# Patient Record
Sex: Female | Born: 1995 | Race: Black or African American | Hispanic: No | Marital: Single | Smoking: Current some day smoker
Health system: Southern US, Community
[De-identification: ages and names within clinical notes are randomized; demographics above are authoritative.]

## PROBLEM LIST (undated history)

## (undated) DIAGNOSIS — R55 Syncope and collapse: Secondary | ICD-10-CM

## (undated) DIAGNOSIS — Z789 Other specified health status: Secondary | ICD-10-CM

## (undated) HISTORY — PX: MOUTH SURGERY: SHX715

---
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
## 2015-11-28 ENCOUNTER — Encounter (HOSPITAL_COMMUNITY): Payer: Self-pay | Admitting: Emergency Medicine

## 2015-11-28 DIAGNOSIS — F17290 Nicotine dependence, other tobacco product, uncomplicated: Secondary | ICD-10-CM | POA: Insufficient documentation

## 2015-11-28 DIAGNOSIS — R05 Cough: Secondary | ICD-10-CM | POA: Diagnosis present

## 2015-11-28 DIAGNOSIS — J069 Acute upper respiratory infection, unspecified: Secondary | ICD-10-CM | POA: Diagnosis not present

## 2015-11-28 NOTE — ED Triage Notes (Signed)
Pt reports nonproductive coughing for a week with a slight fever at times, currently afebrile.  Denies SOB or CP, abd pain, sore throat, or N/V. Went to Urgent Care and received a steroid shot and an anttitussive which helped for a time before coming here with increased coughing.  No flu-like symptoms reported or seen.

## 2015-11-29 ENCOUNTER — Emergency Department (HOSPITAL_COMMUNITY): Payer: 59

## 2015-11-29 ENCOUNTER — Emergency Department (HOSPITAL_COMMUNITY)
Admission: EM | Admit: 2015-11-29 | Discharge: 2015-11-29 | Disposition: A | Discharge status: Home / Self Care | Payer: 59 | Attending: Emergency Medicine | Admitting: Emergency Medicine

## 2015-11-29 DIAGNOSIS — J069 Acute upper respiratory infection, unspecified: Secondary | ICD-10-CM

## 2015-11-29 DIAGNOSIS — B9789 Other viral agents as the cause of diseases classified elsewhere: Secondary | ICD-10-CM

## 2015-11-29 MED ORDER — CETIRIZINE HCL 10 MG PO TABS: 10.0000 mg | ORAL_TABLET | Freq: Every day | ORAL | 1 refills | Status: AC

## 2015-11-29 MED ORDER — HYDROCODONE-HOMATROPINE 5-1.5 MG/5ML PO SYRP: 5.0000 mL | ORAL_SOLUTION | Freq: Every evening | ORAL | 0 refills | Status: AC | PRN

## 2015-11-29 NOTE — ED Provider Notes (Signed)
[facility] DEPT Provider Note   CSN: [id number] Arrival date & time: 11/28/15  2327 By signing my name below, I, Doran Stabler, attest that this documentation has been prepared under the direction and in the presence of [employer], PA-C. Electronically Signed: Doran Stabler, ED Scribe. 11/29/15. 12:27 AM.   History   Chief Complaint Chief Complaint  Patient presents with  . Cough  The history is provided by the patient. No language interpreter was used.   HPI Comments: Priscilla Sanchez is a 20 y.o. female who presents to the Emergency Department with no pertinent PMHx complaining of a persistent cough that began 5 days ago. Pt also reports fever, Tmax 100.50F, sore throat, and nasal congestion. Pt states there is a smoker in her home and with her new HVAC it may be causing her symptoms. She states her symptoms are worse while at home. Pt was seen at Urgent Care today and was given Tessalon Perles. She went home but could not bare the cough so she came to the ED for further evaluation. Pt denies chills, CP, SOB, N/V/D or any other symptoms at this time.   Pt socially smokes Hookha on occasion.   Past Medical History:  Diagnosis Date  . Fainting    There are no active problems to display for this patient.  History reviewed. No pertinent surgical history.  OB History    No data available     Home Medications    Prior to Admission medications   Medication Sig Start Date End Date Taking? Authorizing Provider  cetirizine (ZYRTEC ALLERGY) 10 MG tablet Take 1 tablet (10 mg total) by mouth daily. 11/29/15   Antonietta Breach, PA-C  HYDROcodone-homatropine (HYCODAN) 5-1.5 MG/5ML syrup Take 5 mLs by mouth at bedtime as needed for cough. 11/29/15   Antonietta Breach, PA-C  Norgestim-Eth Estrad Triphasic (ORTHO TRI-CYCLEN LO PO) Take 1 tablet by mouth daily.    Historical Provider, MD   Family History No family history on file.  Social History Social History  Substance Use Topics  . Smoking  status: Current Some Day Smoker    Types: Pipe  . Smokeless tobacco: Never Used  . Alcohol use No   Allergies   Penicillins  Review of Systems Review of Systems A complete 10 system review of systems was obtained and all systems are negative except as noted in the HPI and PMH.    Physical Exam Updated Vital Signs BP 136/86   Pulse 88   Temp 98.9 F (37.2 C) (Oral)   Resp 18   Ht 5\' 6"  (1.676 m)   Wt 53.1 kg   LMP 11/10/2015   SpO2 99%   BMI 18.90 kg/m   Physical Exam  Constitutional: She is oriented to person, place, and time. She appears well-developed and well-nourished. No distress.  Nontoxic-appearing  HENT:  Head: Normocephalic and atraumatic.  Eyes: Conjunctivae and EOM are normal. No scleral icterus.  Neck: Normal range of motion.  Cardiovascular: Normal rate, regular rhythm and intact distal pulses.   Pulmonary/Chest: Effort normal. No respiratory distress. She has no wheezes. She has no rales.  Lungs clear to auscultation bilaterally  Musculoskeletal: Normal range of motion.  Neurological: She is alert and oriented to person, place, and time. She exhibits normal muscle tone. Coordination normal.  Skin: Skin is warm and dry. No rash noted. She is not diaphoretic. No erythema. No pallor.  Psychiatric: She has a normal mood and affect. Her behavior is normal.  Nursing note and vitals reviewed.  ED Treatments / Results  DIAGNOSTIC STUDIES: Oxygen Saturation is 100% on room air, normal by my interpretation.    COORDINATION OF CARE: 12:36 AM Discussed treatment plan with pt at bedside and pt agreed to plan.  Labs (all labs ordered are listed, but only abnormal results are displayed) Labs Reviewed - No data to display  EKG  EKG Interpretation None       Radiology Dg Chest 2 View  Result Date: 11/29/2015 CLINICAL DATA:  Nonproductive cough for 1 week. Subjective intermittent fever. EXAM: CHEST  2 VIEW COMPARISON:  None. FINDINGS: The lungs are  clear. The pulmonary vasculature is normal. Heart size is normal. Hilar and mediastinal contours are unremarkable. There is no pleural effusion. IMPRESSION: No active cardiopulmonary disease. Electronically Signed   By: Andreas Newport M.D.   On: 11/29/2015 01:05    Procedures Procedures (including critical care time)  Medications Ordered in ED Medications - No data to display   Initial Impression / Assessment and Plan / ED Course  I have reviewed the triage vital signs and the nursing notes.  Pertinent labs & imaging results that were available during my care of the patient were reviewed by me and considered in my medical decision making (see chart for details).  Clinical Course     Pt CXR negative for acute infiltrate. Patient's symptoms are consistent with URI, likely viral etiology. Discussed that antibiotics are not indicated for viral infections. Pt will be discharged with symptomatic treatment. Patient verbalizes understanding and is agreeable with plan. Pt is hemodynamically stable and in NAD prior to discharge.   Final Clinical Impressions(s) / ED Diagnoses   Final diagnoses:  Viral URI with cough    New Prescriptions New Prescriptions   CETIRIZINE (ZYRTEC ALLERGY) 10 MG TABLET    Take 1 tablet (10 mg total) by mouth daily.   HYDROCODONE-HOMATROPINE (HYCODAN) 5-1.5 MG/5ML SYRUP    Take 5 mLs by mouth at bedtime as needed for cough.    I personally performed the services described in this documentation, which was scribed in my presence. The recorded information has been reviewed and is accurate.      Antonietta Breach, PA-C 11/29/15 0130    Varney Biles, MD 11/29/15 [phone]

## 2019-05-17 ENCOUNTER — Other Ambulatory Visit: Payer: Self-pay | Admitting: Family Medicine

## 2019-05-17 DIAGNOSIS — N6320 Unspecified lump in the left breast, unspecified quadrant: Secondary | ICD-10-CM

## 2019-05-23 ENCOUNTER — Other Ambulatory Visit: Payer: Self-pay | Admitting: Family Medicine

## 2019-05-23 ENCOUNTER — Ambulatory Visit
Admission: RE | Admit: 2019-05-23 | Discharge: 2019-05-23 | Disposition: A | Discharge status: Home / Self Care | Payer: 59 | Source: Ambulatory Visit | Attending: Family Medicine | Admitting: Family Medicine

## 2019-05-23 ENCOUNTER — Other Ambulatory Visit: Payer: Self-pay

## 2019-05-23 DIAGNOSIS — N6320 Unspecified lump in the left breast, unspecified quadrant: Secondary | ICD-10-CM

## 2019-06-04 ENCOUNTER — Ambulatory Visit
Admission: RE | Admit: 2019-06-04 | Discharge: 2019-06-04 | Disposition: A | Discharge status: Home / Self Care | Payer: No Typology Code available for payment source | Source: Ambulatory Visit | Attending: Family Medicine | Admitting: Family Medicine

## 2019-06-04 ENCOUNTER — Other Ambulatory Visit: Payer: Self-pay

## 2019-06-04 DIAGNOSIS — N6320 Unspecified lump in the left breast, unspecified quadrant: Secondary | ICD-10-CM

## 2019-06-07 ENCOUNTER — Ambulatory Visit: Payer: Self-pay | Admitting: Surgery

## 2019-06-07 DIAGNOSIS — D242 Benign neoplasm of left breast: Secondary | ICD-10-CM

## 2019-06-07 NOTE — H&P (Signed)
Danley Danker Appointment: 06/07/2019 11:00 AM Location: [facility] Patient #: [id number] DOB: 05-Feb-1995 Single / Language: Priscilla Sanchez / Race: Black or African American Female  History of Present Illness Marcello Moores A. Keierra Nudo MD; 06/07/2019 12:34 PM) Patient words: Patient presents for evaluation of left breast mass. She's had she was 12 zolpidem. 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 It's about 5 cm in maximal diameter. It is not causing any pain but there has been no substantial increase in size with minor reduction noted. No pain, discharge or change in the appearance of the breast.      24 year old female with a mass in the left breast that has been stable for approximately 14 years. However the mass now is bothersome to the patient. She reports heaviness in the breast.  EXAM: ULTRASOUND OF THE LEFT BREAST  COMPARISON: Previous exam(s).  FINDINGS: On physical exam, I palpate a discrete mass deep within the retroareolar left breast.  Targeted ultrasound is performed in the left breast at 6 o'clock/retroareolar at the palpable site demonstrating an oval circumscribed hypoechoic mass measuring 5.1 x 2.9 x 5.2 cm, previously measuring up to 5.9 cm.  IMPRESSION: Palpable left breast mass measuring 5.2 cm is stable to slightly decreased in size. 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  RECOMMENDATION: The patient wishes to have the left breast mass excised. Tissue confirmation is recommended prior to surgical referral. The patient will be scheduled for ultrasound-guided core needle biopsy.  I have discussed the findings and recommendations with the patient. If applicable, a reminder letter will be sent to the patient regarding the next appointment.  BI-RADS CATEGORY 2: Benign.   Electronically Signed By: Audie Pinto M.D. On: 05/23/2019 14:10         Diagnosis Breast, left, needle core biopsy, 6  o'clock, retroareolar - FIBROADENOMA. - THERE IS NO EVIDENCE OF MALIGNANCY. - SEE COMMENT.  ADDENDUM REPORT: 06/05/2019 13:17  ADDENDUM: Pathology revealed FIBROADENOMA of the Left breast, 6 o'clock, retroareolar. This was found to be concordant by Dr. Audie Pinto.  Pathology results were discussed with the patient by telephone. The patient reported doing well after the biopsy with tenderness at the site. Post biopsy instructions and care were reviewed and questions were answered. The patient was encouraged to call [facility] for any additional concerns.  Surgical consultation has been arranged with Dr. Erroll Luna at [facility] on Jun 07, 2019.  Pathology results reported by Terie Purser, RN on 06/05/2019.   Electronically Signed By: Audie Pinto M.D. On: 06/05/2019 13:17.  The patient is a 24 year old female.   Allergies Lindwood Coke, RN; 06/07/2019 11:22 AM) Penicillin G Pot in Dextrose *PENICILLINS*  Medication History (Diane Herrin, RN; 06/07/2019 11:22 AM) No Current Medications Medications Reconciled  Social History Lindwood Coke, RN; 06/07/2019 11:20 AM) Alcohol use Occasional alcohol use. Tobacco use Current some day smoker.  Pregnancy / Birth History Lindwood Coke, RN; 06/07/2019 11:20 AM) Age at menarche 89 years. Contraceptive History Oral contraceptives. Gravida 0     Review of Systems (Diane Herrin RN; 06/07/2019 11:20 AM) General Not Present- Appetite Loss, Chills, Fatigue, Fever, Night Sweats, Weight Gain and Weight Loss. HEENT Present- Wears glasses/contact lenses. Not Present- Earache, Hearing Loss, Hoarseness, Nose Bleed, Oral Ulcers, Ringing in the Ears, Seasonal Allergies, Sinus Pain, Sore Throat, Visual Disturbances and Yellow Eyes. Female Genitourinary Not Present- Frequency, Nocturia, Painful Urination, Pelvic Pain and Urgency.  Vitals (Diane Herrin RN; 06/07/2019 11:24  AM) 06/07/2019 11:22 AM Weight: 156.5 lb Height: 64in Body Surface Area: 1.76 m Body Mass Index: 26.86 kg/m  Temp.: 98.24F  Pulse: 118 (Regular)  P.OX: 99% (Room air) BP: 122/70(Sitting, Left Arm, Standard)        Physical Exam (Kingjames Coury A. Anis Cinelli MD; 06/07/2019 12:34 PM)  General Mental Status-Alert. General Appearance-Consistent with stated age. Hydration-Well hydrated. Voice-Normal.  Head and Neck Head-normocephalic, atraumatic with no lesions or palpable masses. Trachea-midline. Thyroid Gland Characteristics - normal size and consistency.  Breast Note: Mobile left breast mass upper outer quadrant. 5 cm in size Right breast normal  Neurologic Neurologic evaluation reveals -alert and oriented x 3 with no impairment of recent or remote memory. Mental Status-Normal.  Musculoskeletal Normal Exam - Left-Upper Extremity Strength Normal and Lower Extremity Strength Normal. Normal Exam - Right-Upper Extremity Strength Normal and Lower Extremity Strength Normal.  Lymphatic Head & Neck  General Head & Neck Lymphatics: Bilateral - Description - Normal. Axillary  General Axillary Region: Bilateral - Description - Normal. Tenderness - Non Tender.    Assessment & Plan (Malillany Kazlauskas A. Caitlain Tweed MD; 06/07/2019 12:35 PM)  Rodman Comp, LEFT (D24.2) Impression: Discussed excision versus observation. She would like to have the area removed we'll set her up for a left breast lumpectomy C localized. She will decide on timing afterwards. Discussed increased in size as well as change in size with pregnancy. She would like to proceed but is unsure of her timing on it. Risk of lumpectomy include bleeding, infection, seroma, more surgery, use of seed/wire, wound care, cosmetic deformity and the need for other treatments, death , blood clots, death. Pt agrees to proceed.  Current Plans You are being scheduled for surgery- Our schedulers will call you.  You  should hear from our office's scheduling department within 5 working days about the location, date, and time of surgery. We try to make accommodations for patient's preferences in scheduling surgery, but sometimes the OR schedule or the surgeon's schedule prevents [place] from making those accommodations.  If you have not heard from our office [phone]) in 5 working days, call the office and ask for your surgeon's nurse.  If you have other questions about your diagnosis, plan, or surgery, call the office and ask for your surgeon's nurse.  Pt Education - [facility] Breast Biopsy HCI: discussed with patient and provided information.

## 2019-06-14 ENCOUNTER — Other Ambulatory Visit: Payer: Self-pay | Admitting: Surgery

## 2019-06-14 DIAGNOSIS — D242 Benign neoplasm of left breast: Secondary | ICD-10-CM

## 2019-07-17 NOTE — Progress Notes (Signed)
[id number] - [place], [place] [address] [place] [address] Phone: [phone] Fax: [phone]  [id number] - [place], [address] [place] [address] Phone: [phone] Fax: [phone]  [id number] - [place], [address] [place] [address] Phone: [phone] Fax: [phone]      Your procedure is scheduled on 07/24/19.  Report to [facility] Main Entrance "A" at 8:00 A.M., and check in at the Admitting office.  Call this number if you have problems the morning of surgery:  [phone]  Call [phone] if you have any questions prior to your surgery date Monday-Friday 8am-4pm    Remember:  Do not eat after midnight the night before your surgery  You may drink clear liquids until 7:00 the morning of your surgery.   Clear liquids allowed are: Water, Non-Citrus Juices (without pulp), Carbonated Beverages, Clear Tea, Black Coffee Only, and Gatorade    Take these medicines the morning of surgery with A SIP OF WATER:  NONE  As of today, STOP taking any Aspirin (unless otherwise instructed by your surgeon) Aleve, Naproxen, Ibuprofen, Motrin, Advil, Goody's, BC's, all herbal medications, fish oil, and all vitamins.                      Do not wear jewelry, make up, or nail polish            Do not wear lotions, powders, perfumes or deodorant.            Do not shave 48 hours prior to surgery.              Do not bring valuables to the hospital.            [facility] is not responsible for any belongings or valuables.  Do NOT Smoke (Tobacco/Vaping) or drink Alcohol 24 hours prior to your procedure If you use a CPAP at night, you may bring all equipment for your overnight stay.   Contacts, glasses, dentures or bridgework may not be worn into surgery.      For patients  admitted to the hospital, discharge time will be determined by your treatment team.   Patients discharged the day of surgery will not be allowed to drive home, and someone needs to stay with them for 24 hours.    Special instructions:   [facility]- Preparing For Surgery  Before surgery, you can play an important role. Because skin is not sterile, your skin needs to be as free of germs as possible. You can reduce the number of germs on your skin by washing with CHG (chlorahexidine gluconate) Soap before surgery.  CHG is an antiseptic cleaner which kills germs and bonds with the skin to continue killing germs even after washing.    Oral Hygiene is also important to reduce your risk of infection.  Remember - BRUSH YOUR TEETH THE MORNING OF SURGERY WITH YOUR REGULAR TOOTHPASTE  Please do not use if you have an allergy to CHG or antibacterial soaps. If your skin becomes reddened/irritated stop using the CHG.  Do not shave (including legs and underarms) for at least 48 hours prior to first CHG shower. It is OK to shave your face.  Please follow these instructions carefully.   1. Shower the NIGHT BEFORE SURGERY and the MORNING OF SURGERY with CHG  Soap.   2. If you chose to wash your hair, wash your hair first as usual with your normal shampoo.  3. After you shampoo, rinse your hair and body thoroughly to remove the shampoo.  4. Use CHG as you would any other liquid soap. You can apply CHG directly to the skin and wash gently with a scrungie or a clean washcloth.   5. Apply the CHG Soap to your body ONLY FROM THE NECK DOWN.  Do not use on open wounds or open sores. Avoid contact with your eyes, ears, mouth and genitals (private parts). Wash Face and genitals (private parts)  with your normal soap.   6. Wash thoroughly, paying special attention to the area where your surgery will be performed.  7. Thoroughly rinse your body with warm water from the neck down.  8. DO NOT shower/wash with your  normal soap after using and rinsing off the CHG Soap.  9. Pat yourself dry with a CLEAN TOWEL.  10. Wear CLEAN PAJAMAS to bed the night before surgery  11. Place CLEAN SHEETS on your bed the night of your first shower and DO NOT SLEEP WITH PETS.   Day of Surgery: Wear Clean/Comfortable clothing the morning of surgery Do not apply any deodorants/lotions.   Remember to brush your teeth WITH YOUR REGULAR TOOTHPASTE.   Please read over the following fact sheets that you were given.

## 2019-07-18 ENCOUNTER — Encounter (HOSPITAL_COMMUNITY)
Admission: RE | Admit: 2019-07-18 | Discharge: 2019-07-18 | Disposition: A | Discharge status: Home / Self Care | Payer: No Typology Code available for payment source | Source: Ambulatory Visit | Attending: Surgery | Admitting: Surgery

## 2019-07-18 ENCOUNTER — Other Ambulatory Visit: Payer: Self-pay

## 2019-07-18 ENCOUNTER — Encounter (HOSPITAL_COMMUNITY): Payer: Self-pay

## 2019-07-18 DIAGNOSIS — Z01812 Encounter for preprocedural laboratory examination: Secondary | ICD-10-CM | POA: Insufficient documentation

## 2019-07-18 HISTORY — DX: Other specified health status: Z78.9

## 2019-07-18 LAB — CBC
HCT: 42.6 % (ref 36.0–46.0)
Hemoglobin: 13.7 g/dL (ref 12.0–15.0)
MCH: 30.5 pg (ref 26.0–34.0)
MCHC: 32.2 g/dL (ref 30.0–36.0)
MCV: 94.9 fL (ref 80.0–100.0)
Platelets: 272 10*3/uL (ref 150–400)
RBC: 4.49 MIL/uL (ref 3.87–5.11)
RDW: 12.8 % (ref 11.5–15.5)
WBC: 4.3 10*3/uL (ref 4.0–10.5)
nRBC: 0 % (ref 0.0–0.2)

## 2019-07-18 LAB — POCT PREGNANCY, URINE: Preg Test, Ur: NEGATIVE

## 2019-07-18 NOTE — Progress Notes (Signed)
PCP:  Denies Cardiologist:  Denies  EKG:  N/A CXR:  NA ECHO:  Denies Stress Test:  Denies Cardiac Cath:  Denies  Covid test 07/20/19  Patient denies shortness of breath, fever, cough, and chest pain at PAT appointment.  Patient verbalized understanding of instructions provided today at the PAT appointment.  Patient asked to review instructions at home and day of surgery.

## 2019-07-20 ENCOUNTER — Other Ambulatory Visit (HOSPITAL_COMMUNITY)
Admission: RE | Admit: 2019-07-20 | Discharge: 2019-07-20 | Disposition: A | Discharge status: Home / Self Care | Payer: No Typology Code available for payment source | Source: Ambulatory Visit | Attending: Surgery | Admitting: Surgery

## 2019-07-20 DIAGNOSIS — Z01812 Encounter for preprocedural laboratory examination: Secondary | ICD-10-CM | POA: Insufficient documentation

## 2019-07-20 DIAGNOSIS — Z20822 Contact with and (suspected) exposure to covid-19: Secondary | ICD-10-CM | POA: Diagnosis not present

## 2019-07-20 LAB — SARS CORONAVIRUS 2 (TAT 6-24 HRS): SARS Coronavirus 2: NEGATIVE

## 2019-07-23 ENCOUNTER — Inpatient Hospital Stay: Admission: RE | Admit: 2019-07-23 | Payer: No Typology Code available for payment source | Source: Ambulatory Visit

## 2019-07-23 ENCOUNTER — Ambulatory Visit
Admission: RE | Admit: 2019-07-23 | Discharge: 2019-07-23 | Disposition: A | Discharge status: Home / Self Care | Payer: No Typology Code available for payment source | Source: Ambulatory Visit | Attending: Surgery | Admitting: Surgery

## 2019-07-23 ENCOUNTER — Other Ambulatory Visit: Payer: Self-pay | Admitting: Surgery

## 2019-07-23 ENCOUNTER — Other Ambulatory Visit: Payer: Self-pay

## 2019-07-23 DIAGNOSIS — D242 Benign neoplasm of left breast: Secondary | ICD-10-CM

## 2019-07-24 ENCOUNTER — Encounter (HOSPITAL_COMMUNITY): Payer: Self-pay | Admitting: Surgery

## 2019-07-24 ENCOUNTER — Ambulatory Visit
Admission: RE | Admit: 2019-07-24 | Discharge: 2019-07-24 | Disposition: A | Discharge status: Home / Self Care | Payer: No Typology Code available for payment source | Source: Ambulatory Visit | Attending: Surgery | Admitting: Surgery

## 2019-07-24 ENCOUNTER — Ambulatory Visit (HOSPITAL_COMMUNITY): Payer: No Typology Code available for payment source | Admitting: Certified Registered"

## 2019-07-24 ENCOUNTER — Ambulatory Visit (HOSPITAL_COMMUNITY)
Admission: RE | Admit: 2019-07-24 | Discharge: 2019-07-24 | Disposition: A | Discharge status: Home / Self Care | Payer: No Typology Code available for payment source | Attending: Surgery | Admitting: Surgery

## 2019-07-24 ENCOUNTER — Other Ambulatory Visit: Payer: Self-pay

## 2019-07-24 ENCOUNTER — Encounter (HOSPITAL_COMMUNITY)
Admission: RE | Disposition: A | Discharge status: Home / Self Care | Payer: Self-pay | Source: Home / Self Care | Attending: Surgery

## 2019-07-24 DIAGNOSIS — F17200 Nicotine dependence, unspecified, uncomplicated: Secondary | ICD-10-CM | POA: Insufficient documentation

## 2019-07-24 DIAGNOSIS — D242 Benign neoplasm of left breast: Secondary | ICD-10-CM | POA: Diagnosis not present

## 2019-07-24 HISTORY — PX: BREAST LUMPECTOMY WITH RADIOACTIVE SEED LOCALIZATION: SHX6424

## 2019-07-24 LAB — POCT PREGNANCY, URINE: Preg Test, Ur: NEGATIVE

## 2019-07-24 SURGERY — BREAST LUMPECTOMY WITH RADIOACTIVE SEED LOCALIZATION
Anesthesia: General | Site: Breast | Laterality: Left

## 2019-07-24 MED ORDER — CLINDAMYCIN PHOSPHATE 900 MG/50ML IV SOLN
900.0000 mg | INTRAVENOUS | Status: AC
  Administered 2019-07-24: 900 mg via INTRAVENOUS
  Filled 2019-07-24: qty 50

## 2019-07-24 MED ORDER — EPHEDRINE 5 MG/ML INJ
INTRAVENOUS | Status: AC
  Filled 2019-07-24: qty 10

## 2019-07-24 MED ORDER — FENTANYL CITRATE (PF) 250 MCG/5ML IJ SOLN
INTRAMUSCULAR | Status: AC
  Filled 2019-07-24: qty 5

## 2019-07-24 MED ORDER — CHLORHEXIDINE GLUCONATE CLOTH 2 % EX PADS: 6.0000 | MEDICATED_PAD | Freq: Once | CUTANEOUS | Status: DC

## 2019-07-24 MED ORDER — ONDANSETRON HCL 4 MG/2ML IJ SOLN
INTRAMUSCULAR | Status: AC
  Filled 2019-07-24: qty 2

## 2019-07-24 MED ORDER — LACTATED RINGERS IV SOLN: INTRAVENOUS | Status: DC

## 2019-07-24 MED ORDER — HYDROCODONE-ACETAMINOPHEN 5-325 MG PO TABS
1.0000 | ORAL_TABLET | Freq: Four times a day (QID) | ORAL | 0 refills | Status: AC | PRN
Start: 2019-07-24 — End: ?

## 2019-07-24 MED ORDER — ACETAMINOPHEN 10 MG/ML IV SOLN: 1000.0000 mg | Freq: Once | INTRAVENOUS | Status: DC | PRN

## 2019-07-24 MED ORDER — FENTANYL CITRATE (PF) 100 MCG/2ML IJ SOLN: 25.0000 ug | INTRAMUSCULAR | Status: DC | PRN

## 2019-07-24 MED ORDER — LIDOCAINE 2% (20 MG/ML) 5 ML SYRINGE
INTRAMUSCULAR | Status: AC
  Filled 2019-07-24: qty 5

## 2019-07-24 MED ORDER — HYDROCODONE-ACETAMINOPHEN 5-325 MG PO TABS
ORAL_TABLET | ORAL | Status: AC
  Filled 2019-07-24: qty 1

## 2019-07-24 MED ORDER — BUPIVACAINE HCL (PF) 0.25 % IJ SOLN
INTRAMUSCULAR | Status: DC | PRN
  Administered 2019-07-24: 17 mL

## 2019-07-24 MED ORDER — CELECOXIB 200 MG PO CAPS
200.0000 mg | ORAL_CAPSULE | ORAL | Status: AC
  Administered 2019-07-24: 200 mg via ORAL
  Filled 2019-07-24: qty 1

## 2019-07-24 MED ORDER — HYDROCODONE-ACETAMINOPHEN 5-325 MG PO TABS
1.0000 | ORAL_TABLET | Freq: Once | ORAL | Status: AC
  Administered 2019-07-24: 1 via ORAL

## 2019-07-24 MED ORDER — PROPOFOL 10 MG/ML IV BOLUS
INTRAVENOUS | Status: AC
  Filled 2019-07-24: qty 20

## 2019-07-24 MED ORDER — ONDANSETRON HCL 4 MG/2ML IJ SOLN
INTRAMUSCULAR | Status: DC | PRN
  Administered 2019-07-24: 4 mg via INTRAVENOUS

## 2019-07-24 MED ORDER — STERILE WATER FOR IRRIGATION IR SOLN
Status: DC | PRN
  Administered 2019-07-24: 1000 mL

## 2019-07-24 MED ORDER — LIDOCAINE 2% (20 MG/ML) 5 ML SYRINGE
INTRAMUSCULAR | Status: DC | PRN
  Administered 2019-07-24: 100 mg via INTRAVENOUS

## 2019-07-24 MED ORDER — MIDAZOLAM HCL 2 MG/2ML IJ SOLN
INTRAMUSCULAR | Status: AC
  Filled 2019-07-24: qty 2

## 2019-07-24 MED ORDER — 0.9 % SODIUM CHLORIDE (POUR BTL) OPTIME
TOPICAL | Status: DC | PRN
  Administered 2019-07-24: 1000 mL

## 2019-07-24 MED ORDER — ACETAMINOPHEN 500 MG PO TABS
1000.0000 mg | ORAL_TABLET | ORAL | Status: AC
  Administered 2019-07-24: 1000 mg via ORAL
  Filled 2019-07-24: qty 2

## 2019-07-24 MED ORDER — PROMETHAZINE HCL 25 MG/ML IJ SOLN: 6.2500 mg | INTRAMUSCULAR | Status: DC | PRN

## 2019-07-24 MED ORDER — FENTANYL CITRATE (PF) 100 MCG/2ML IJ SOLN
INTRAMUSCULAR | Status: DC | PRN
  Administered 2019-07-24: 100 ug via INTRAVENOUS
  Administered 2019-07-24: 50 ug via INTRAVENOUS

## 2019-07-24 MED ORDER — CHLORHEXIDINE GLUCONATE 0.12 % MT SOLN: 15.0000 mL | Freq: Once | OROMUCOSAL | Status: AC

## 2019-07-24 MED ORDER — GABAPENTIN 300 MG PO CAPS
300.0000 mg | ORAL_CAPSULE | ORAL | Status: AC
  Administered 2019-07-24: 300 mg via ORAL
  Filled 2019-07-24: qty 1

## 2019-07-24 MED ORDER — PROPOFOL 10 MG/ML IV BOLUS
INTRAVENOUS | Status: DC | PRN
  Administered 2019-07-24: 140 mg via INTRAVENOUS

## 2019-07-24 MED ORDER — DEXAMETHASONE SODIUM PHOSPHATE 10 MG/ML IJ SOLN
INTRAMUSCULAR | Status: AC
  Filled 2019-07-24: qty 1

## 2019-07-24 MED ORDER — ACETAMINOPHEN 160 MG/5ML PO SOLN: 325.0000 mg | Freq: Once | ORAL | Status: DC | PRN

## 2019-07-24 MED ORDER — MIDAZOLAM HCL 5 MG/5ML IJ SOLN
INTRAMUSCULAR | Status: DC | PRN
  Administered 2019-07-24: 2 mg via INTRAVENOUS

## 2019-07-24 MED ORDER — ROCURONIUM BROMIDE 10 MG/ML (PF) SYRINGE
PREFILLED_SYRINGE | INTRAVENOUS | Status: AC
  Filled 2019-07-24: qty 10

## 2019-07-24 MED ORDER — ACETAMINOPHEN 325 MG PO TABS: 325.0000 mg | ORAL_TABLET | Freq: Once | ORAL | Status: DC | PRN

## 2019-07-24 MED ORDER — BUPIVACAINE HCL (PF) 0.25 % IJ SOLN
INTRAMUSCULAR | Status: AC
  Filled 2019-07-24: qty 30

## 2019-07-24 MED ORDER — ORAL CARE MOUTH RINSE: 15.0000 mL | Freq: Once | OROMUCOSAL | Status: AC

## 2019-07-24 MED ORDER — MEPERIDINE HCL 25 MG/ML IJ SOLN: 6.2500 mg | INTRAMUSCULAR | Status: DC | PRN

## 2019-07-24 MED ORDER — DEXAMETHASONE SODIUM PHOSPHATE 10 MG/ML IJ SOLN
INTRAMUSCULAR | Status: DC | PRN
  Administered 2019-07-24: 10 mg via INTRAVENOUS

## 2019-07-24 MED ORDER — CHLORHEXIDINE GLUCONATE 0.12 % MT SOLN
OROMUCOSAL | Status: AC
  Administered 2019-07-24: 15 mL
  Filled 2019-07-24: qty 15

## 2019-07-24 MED ORDER — IBUPROFEN 800 MG PO TABS
800.0000 mg | ORAL_TABLET | Freq: Three times a day (TID) | ORAL | 0 refills | Status: AC | PRN
Start: 2019-07-24 — End: ?

## 2019-07-24 MED ORDER — EPHEDRINE SULFATE-NACL 50-0.9 MG/10ML-% IV SOSY
PREFILLED_SYRINGE | INTRAVENOUS | Status: DC | PRN
  Administered 2019-07-24: 15 mg via INTRAVENOUS

## 2019-07-24 SURGICAL SUPPLY — 38 items
APPLIER CLIP 9.375 MED OPEN (MISCELLANEOUS)
BINDER BREAST LRG (GAUZE/BANDAGES/DRESSINGS) ×3 IMPLANT
BINDER BREAST XLRG (GAUZE/BANDAGES/DRESSINGS) IMPLANT
CANISTER SUCT 3000ML PPV (MISCELLANEOUS) IMPLANT
CHLORAPREP W/TINT 26 (MISCELLANEOUS) ×3 IMPLANT
CLIP APPLIE 9.375 MED OPEN (MISCELLANEOUS) IMPLANT
COVER PROBE W GEL 5X96 (DRAPES) ×3 IMPLANT
COVER SURGICAL LIGHT HANDLE (MISCELLANEOUS) ×3 IMPLANT
COVER WAND RF STERILE (DRAPES) ×3 IMPLANT
DERMABOND ADVANCED (GAUZE/BANDAGES/DRESSINGS) ×2
DERMABOND ADVANCED .7 DNX12 (GAUZE/BANDAGES/DRESSINGS) ×1 IMPLANT
DEVICE DUBIN SPECIMEN MAMMOGRA (MISCELLANEOUS) ×3 IMPLANT
DRAPE CHEST BREAST 15X10 FENES (DRAPES) ×3 IMPLANT
ELECT CAUTERY BLADE 6.4 (BLADE) ×3 IMPLANT
ELECT REM PT RETURN 9FT ADLT (ELECTROSURGICAL) ×3
ELECTRODE REM PT RTRN 9FT ADLT (ELECTROSURGICAL) ×1 IMPLANT
GLOVE BIO SURGEON STRL SZ8 (GLOVE) ×3 IMPLANT
GLOVE BIOGEL PI IND STRL 8 (GLOVE) ×1 IMPLANT
GLOVE BIOGEL PI INDICATOR 8 (GLOVE) ×2
GOWN STRL REUS W/ TWL LRG LVL3 (GOWN DISPOSABLE) ×1 IMPLANT
GOWN STRL REUS W/ TWL XL LVL3 (GOWN DISPOSABLE) ×1 IMPLANT
GOWN STRL REUS W/TWL LRG LVL3 (GOWN DISPOSABLE) ×2
GOWN STRL REUS W/TWL XL LVL3 (GOWN DISPOSABLE) ×2
KIT BASIN OR (CUSTOM PROCEDURE TRAY) ×3 IMPLANT
KIT MARKER MARGIN INK (KITS) IMPLANT
LIGHT WAVEGUIDE WIDE FLAT (MISCELLANEOUS) IMPLANT
NEEDLE HYPO 25GX1X1/2 BEV (NEEDLE) IMPLANT
NS IRRIG 1000ML POUR BTL (IV SOLUTION) IMPLANT
PACK GENERAL/GYN (CUSTOM PROCEDURE TRAY) ×3 IMPLANT
PENCIL SMOKE EVACUATOR (MISCELLANEOUS) ×3 IMPLANT
SUT MNCRL AB 4-0 PS2 18 (SUTURE) ×3 IMPLANT
SUT SILK 2 0 SH (SUTURE) IMPLANT
SUT VIC AB 2-0 SH 27 (SUTURE) ×2
SUT VIC AB 2-0 SH 27XBRD (SUTURE) ×1 IMPLANT
SUT VIC AB 3-0 SH 27 (SUTURE) ×2
SUT VIC AB 3-0 SH 27X BRD (SUTURE) ×1 IMPLANT
SYR CONTROL 10ML LL (SYRINGE) IMPLANT
TOWEL GREEN STERILE FF (TOWEL DISPOSABLE) IMPLANT

## 2019-07-24 NOTE — Op Note (Signed)
Preoperative diagnosis: Left breast fibroadenoma  Postoperative diagnosis: Same  Procedure: Left breast seed localized lumpectomy  Surgeon: Erroll Luna, MD  Anesthesia: LMA with 0.25% Marcaine plain  EBL: Minimal  Specimen: Left breast mass with seed and clip verified by Faxitron  Drains: None  Indications for procedure: Patient is a 24 year old female with a biopsy-proven left breast fibroadenoma.  He was about 6 cm she desired removal.  It has been increasing in size.  Risk, benefits as well as cosmetic outcome discussed with her.  Options of nonoperative treatment were discussed but given size and increasing size she desired removal.  I felt this is very reasonable given her breast size as well.The procedure has been discussed with the patient. Alternatives to surgery have been discussed with the patient.  Risks of surgery include bleeding,  Infection,  Seroma formation, death,  and the need for further surgery.   The patient understands and wishes to proceed.  Description of procedure: The patient was met in the holding area.  Questions were answered.  She underwent neoprobe investigation of the mass and seed was localized left breast subareolar position.  Films were not available for use as was ultrasound guidance.  This was marked as correct site.  She is taken back to room.  She is placed supine upon the OR table.  After induction of general esthesia, left breast was prepped and draped in sterile fashion timeout performed.  Proper patient, site and procedure verified.  She received appropriate preoperative antibiotics.  Neoprobe was used and the seed was notified just at the level of the 6:00 region of the nipple were complex.  Curvilinear incision was made along this border.  Dissection was carried down and a well-circumscribed 6 x 6 mm mass was identified and well encapsulated.  It was excised with grossly negative margins.  Hemostasis achieved.  Local anesthetic was demonstrated in the  lumpectomy cavities.  It was then closed with 3-0 Vicryl for Monocryl.  Dermabond applied.  Breast binder placed.  All counts were found to be correct.  The patient was awoke extubated taken recovery in satisfactory condition.

## 2019-07-24 NOTE — Anesthesia Procedure Notes (Signed)
Procedure Name: LMA Insertion Date/Time: 07/24/2019 12:10 PM Performed by: Lance Coon, CRNA Pre-anesthesia Checklist: Patient identified, Emergency Drugs available, Suction available, Patient being monitored and Timeout performed Patient Re-evaluated:Patient Re-evaluated prior to induction Oxygen Delivery Method: Circle system utilized Preoxygenation: Pre-oxygenation with 100% oxygen Induction Type: IV induction LMA: LMA inserted LMA Size: 3.0 Number of attempts: 1 Placement Confirmation: positive ETCO2 and breath sounds checked- equal and bilateral Tube secured with: Tape Dental Injury: Teeth and Oropharynx as per pre-operative assessment

## 2019-07-24 NOTE — Interval H&P Note (Signed)
History and Physical Interval Note:  07/24/2019 12:01 PM  Priscilla Sanchez  has presented today for surgery, with the diagnosis of LEFT BREAST FIBROADENOMA.  The various methods of treatment have been discussed with the patient and family. After consideration of risks, benefits and other options for treatment, the patient has consented to  Procedure(s): LEFT BREAST LUMPECTOMY WITH RADIOACTIVE SEED LOCALIZATION (Left) as a surgical intervention.  The patient's history has been reviewed, patient examined, no change in status, stable for surgery.  I have reviewed the patient's chart and labs.  Questions were answered to the patient's satisfaction.     Turner Daniels MD

## 2019-07-24 NOTE — Anesthesia Postprocedure Evaluation (Signed)
Anesthesia Post Note  Patient: Priscilla Sanchez  Procedure(s) Performed: LEFT BREAST LUMPECTOMY WITH RADIOACTIVE SEED LOCALIZATION (Left Breast)     Patient location during evaluation: PACU Anesthesia Type: General Level of consciousness: awake and alert Pain management: pain level controlled Vital Signs Assessment: post-procedure vital signs reviewed and stable Respiratory status: spontaneous breathing, nonlabored ventilation, respiratory function stable and patient connected to nasal cannula oxygen Cardiovascular status: blood pressure returned to baseline and stable Postop Assessment: no apparent nausea or vomiting Anesthetic complications: no   No complications documented.  Last Vitals:  Vitals:   07/24/19 1330 07/24/19 1345  BP: 137/79 116/88  Pulse: 82 60  Resp: 19 13  Temp:  (!) 36.2 C  SpO2: 100% 100%    Last Pain:  Vitals:   07/24/19 1345  TempSrc:   PainSc: 2                  Effie Berkshire

## 2019-07-24 NOTE — Anesthesia Preprocedure Evaluation (Signed)
Anesthesia Evaluation  Patient identified by MRN, date of birth, ID band Patient awake    Reviewed: Allergy & Precautions, NPO status , Patient's Chart, lab work & pertinent test results  Airway Mallampati: I  TM Distance: >3 FB Neck ROM: Full    Dental  (+) Teeth Intact, Dental Advisory Given   Pulmonary Current Smoker,    breath sounds clear to auscultation       Cardiovascular negative cardio ROS   Rhythm:Regular Rate:Normal     Neuro/Psych negative neurological ROS  negative psych ROS   GI/Hepatic negative GI ROS, Neg liver ROS,   Endo/Other  negative endocrine ROS  Renal/GU negative Renal ROS     Musculoskeletal negative musculoskeletal ROS (+)   Abdominal Normal abdominal exam  (+)   Peds  Hematology negative hematology ROS (+)   Anesthesia Other Findings   Reproductive/Obstetrics                             Anesthesia Physical Anesthesia Plan  ASA: II  Anesthesia Plan: General   Post-op Pain Management:    Induction: Intravenous  PONV Risk Score and Plan: 3 and Ondansetron, Dexamethasone and Midazolam  Airway Management Planned: LMA  Additional Equipment: None  Intra-op Plan:   Post-operative Plan: Extubation in OR  Informed Consent: I have reviewed the patients History and Physical, chart, labs and discussed the procedure including the risks, benefits and alternatives for the proposed anesthesia with the patient or authorized representative who has indicated his/her understanding and acceptance.     Dental advisory given  Plan Discussed with: CRNA  Anesthesia Plan Comments:         Anesthesia Quick Evaluation

## 2019-07-24 NOTE — H&P (Signed)
Danley Danker Appointment: 06/07/2019 11:00 AM Location: [facility] Patient #: [id number] DOB: September 23, 1995 Single / Language: Cleophus Molt / Race: Black or African American Female  History of Present Illness Marcello Moores A. Dameon Soltis MD; 06/07/2019 12:34 PM) Patient words: Patient presents for evaluation of left breast mass. She's had she was 24 YO. 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 It's about 5 cm in maximal diameter. It is not causing any pain but there has been no substantial increase in size with minor reduction noted. No pain, discharge or change in the appearance of the breast.      24 year old female with a mass in the left breast that has been stable for approximately 14 years. However the mass now is bothersome to the patient. She reports heaviness in the breast.  EXAM: ULTRASOUND OF THE LEFT BREAST  COMPARISON: Previous exam(s).  FINDINGS: On physical exam, I palpate a discrete mass deep within the retroareolar left breast.  Targeted ultrasound is performed in the left breast at 6 o'clock/retroareolar at the palpable site demonstrating an oval circumscribed hypoechoic mass measuring 5.1 x 2.9 x 5.2 cm, previously measuring up to 5.9 cm.  IMPRESSION: Palpable left breast mass measuring 5.2 cm is stable to slightly decreased in size. 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  RECOMMENDATION: The patient wishes to have the left breast mass excised. Tissue confirmation is recommended prior to surgical referral. The patient will be scheduled for ultrasound-guided core needle biopsy.  I have discussed the findings and recommendations with the patient. If applicable, a reminder letter will be sent to the patient regarding the next appointment.  BI-RADS CATEGORY 2: Benign.   Electronically Signed By: Audie Pinto M.D. On: 05/23/2019 14:10         Diagnosis Breast, left, needle  core biopsy, 6 o'clock, retroareolar - FIBROADENOMA. - THERE IS NO EVIDENCE OF MALIGNANCY. - SEE COMMENT.  ADDENDUM REPORT: 06/05/2019 13:17  ADDENDUM: Pathology revealed FIBROADENOMA of the Left breast, 6 o'clock, retroareolar. This was found to be concordant by Dr. Audie Pinto.  Pathology results were discussed with the patient by telephone. The patient reported doing well after the biopsy with tenderness at the site. Post biopsy instructions and care were reviewed and questions were answered. The patient was encouraged to call [facility] for any additional concerns.  Surgical consultation has been arranged with Dr. Erroll Luna at [facility] on Jun 07, 2019.  Pathology results reported by Terie Purser, RN on 06/05/2019.   Electronically Signed By: Audie Pinto M.D. On: 06/05/2019 13:17.  The patient is a 24 year old female.   Allergies Lindwood Coke, RN; 06/07/2019 11:22 AM) Penicillin G Pot in Dextrose *PENICILLINS*  Medication History (Diane Herrin, RN; 06/07/2019 11:22 AM) No Current Medications Medications Reconciled  Social History Lindwood Coke, RN; 06/07/2019 11:20 AM) Alcohol use Occasional alcohol use. Tobacco use Current some day smoker.  Pregnancy / Birth History Lindwood Coke, RN; 06/07/2019 11:20 AM) Age at menarche 18 years. Contraceptive History Oral contraceptives. Gravida 0     Review of Systems (Diane Herrin RN; 06/07/2019 11:20 AM) General Not Present- Appetite Loss, Chills, Fatigue, Fever, Night Sweats, Weight Gain and Weight Loss. HEENT Present- Wears glasses/contact lenses. Not Present- Earache, Hearing Loss, Hoarseness, Nose Bleed, Oral Ulcers, Ringing in the Ears, Seasonal Allergies, Sinus Pain, Sore Throat, Visual Disturbances and Yellow Eyes. Female Genitourinary Not Present- Frequency, Nocturia, Painful Urination, Pelvic Pain and Urgency.  Vitals (Diane  Herrin RN; 06/07/2019 11:24  AM) 06/07/2019 11:22 AM Weight: 156.5 lb Height: 64in Body Surface Area: 1.76 m Body Mass Index: 26.86 kg/m  Temp.: 98.8F  Pulse: 118 (Regular)  P.OX: 99% (Room air) BP: 122/70(Sitting, Left Arm, Standard)        Physical Exam (Caden Fukushima A. Dayan Kreis MD; 06/07/2019 12:34 PM)  General Mental Status-Alert. General Appearance-Consistent with stated age. Hydration-Well hydrated. Voice-Normal.  Head and Neck Head-normocephalic, atraumatic with no lesions or palpable masses. Trachea-midline. Thyroid Gland Characteristics - normal size and consistency.  Breast Note: Mobile left breast mass upper outer quadrant. 5 cm in size Right breast normal  Neurologic Neurologic evaluation reveals -alert and oriented x 3 with no impairment of recent or remote memory. Mental Status-Normal.  Musculoskeletal Normal Exam - Left-Upper Extremity Strength Normal and Lower Extremity Strength Normal. Normal Exam - Right-Upper Extremity Strength Normal and Lower Extremity Strength Normal.  Lymphatic Head & Neck  General Head & Neck Lymphatics: Bilateral - Description - Normal. Axillary  General Axillary Region: Bilateral - Description - Normal. Tenderness - Non Tender.    Assessment & Plan (Afomia Blackley A. Aryav Wimberly MD; 06/07/2019 12:35 PM)  Rodman Comp, LEFT (D24.2) Impression: Discussed excision versus observation. She would like to have the area removed we'll set her up for a left breast lumpectomy C localized. She will decide on timing afterwards. Discussed increased in size as well as change in size with pregnancy. She would like to proceed but is unsure of her timing on it. Risk of lumpectomy include bleeding, infection, seroma, more surgery, use of seed/wire, wound care, cosmetic deformity and the need for other treatments, death , blood clots, death. Pt agrees to proceed.  Current Plans You are being scheduled for  surgery- Our schedulers will call you.  You should hear from our office's scheduling department within 5 working days about the location, date, and time of surgery. We try to make accommodations for patient's preferences in scheduling surgery, but sometimes the OR schedule or the surgeon's schedule prevents [place] from making those accommodations.  If you have not heard from our office [phone]) in 5 working days, call the office and ask for your surgeon's nurse.  If you have other questions about your diagnosis, plan, or surgery, call the office and ask for your surgeon's nurse.  Pt Education - [facility] Breast Biopsy HCI: discussed with patient and provided information.

## 2019-07-24 NOTE — Transfer of Care (Signed)
Immediate Anesthesia Transfer of Care Note  Patient: Priscilla Sanchez  Procedure(s) Performed: LEFT BREAST LUMPECTOMY WITH RADIOACTIVE SEED LOCALIZATION (Left Breast)  Patient Location: PACU  Anesthesia Type:General  Level of Consciousness: drowsy and patient cooperative  Airway & Oxygen Therapy: Patient Spontanous Breathing  Post-op Assessment: Report given to RN and Post -op Vital signs reviewed and stable  Post vital signs: Reviewed and stable  Last Vitals:  Vitals Value Taken Time  BP 139/81 07/24/19 1314  Temp    Pulse 83 07/24/19 1315  Resp 11 07/24/19 1315  SpO2 100 % 07/24/19 1315  Vitals shown include unvalidated device data.  Last Pain:  Vitals:   07/24/19 0812  TempSrc: Oral         Complications: No complications documented.

## 2019-07-24 NOTE — Discharge Instructions (Signed)
[facility] °Office Phone Number [phone] ° °BREAST BIOPSY/ PARTIAL MASTECTOMY: POST OP INSTRUCTIONS ° °Always review your discharge instruction sheet given to you by the facility where your surgery was performed. ° °IF YOU HAVE DISABILITY OR FAMILY LEAVE FORMS, YOU MUST BRING THEM TO THE OFFICE FOR PROCESSING.  DO NOT GIVE THEM TO YOUR DOCTOR. ° °1. A prescription for pain medication may be given to you upon discharge.  Take your pain medication as prescribed, if needed.  If narcotic pain medicine is not needed, then you may take acetaminophen (Tylenol) or ibuprofen (Advil) as needed. °2. Take your usually prescribed medications unless otherwise directed °3. If you need a refill on your pain medication, please contact your pharmacy.  They will contact our office to request authorization.  Prescriptions will not be filled after 5pm or on week-ends. °4. You should eat very light the first 24 hours after surgery, such as soup, crackers, pudding, etc.  Resume your normal diet the day after surgery. °5. Most patients will experience some swelling and bruising in the breast.  Ice packs and a good support bra will help.  Swelling and bruising can take several days to resolve.  °6. It is common to experience some constipation if taking pain medication after surgery.  Increasing fluid intake and taking a stool softener will usually help or prevent this problem from occurring.  A mild laxative (Milk of Magnesia or Miralax) should be taken according to package directions if there are no bowel movements after 48 hours. °7. Unless discharge instructions indicate otherwise, you may remove your bandages 24-48 hours after surgery, and you may shower at that time.  You may have steri-strips (small skin tapes) in place directly over the incision.  These strips should be left on the skin for 7-10 days.  If your surgeon used skin glue on the incision, you may shower in 24 hours.  The glue will flake off over the  next 2-3 weeks.  Any sutures or staples will be removed at the office during your follow-up visit. °8. ACTIVITIES:  You may resume regular daily activities (gradually increasing) beginning the next day.  Wearing a good support bra or sports bra minimizes pain and swelling.  You may have sexual intercourse when it is comfortable. °a. You may drive when you no longer are taking prescription pain medication, you can comfortably wear a seatbelt, and you can safely maneuver your car and apply brakes. °b. RETURN TO WORK:  ______________________________________________________________________________________ °9. You should see your doctor in the office for a follow-up appointment approximately two weeks after your surgery.  Your doctor’s nurse will typically make your follow-up appointment when she calls you with your pathology report.  Expect your pathology report 2-3 business days after your surgery.  You may call to check if you do not hear from us after three days. °10. OTHER INSTRUCTIONS: _______________________________________________________________________________________________ _____________________________________________________________________________________________________________________________________ °_____________________________________________________________________________________________________________________________________ °_____________________________________________________________________________________________________________________________________ ° °WHEN TO CALL YOUR DOCTOR: °1. Fever over 101.0 °2. Nausea and/or vomiting. °3. Extreme swelling or bruising. °4. Continued bleeding from incision. °5. Increased pain, redness, or drainage from the incision. ° °The clinic staff is available to answer your questions during regular business hours.  Please don’t hesitate to call and ask to speak to one of the nurses for clinical concerns.  If you have a medical emergency, go to the nearest  emergency room or call 911.  A surgeon from [facility] is always on call at the hospital. ° °For further questions, please visit [email or web address]  °

## 2019-07-25 ENCOUNTER — Encounter (HOSPITAL_COMMUNITY): Payer: Self-pay | Admitting: Surgery

## 2019-07-25 LAB — SURGICAL PATHOLOGY

## 2020-07-20 ENCOUNTER — Encounter (HOSPITAL_COMMUNITY): Payer: Self-pay

## 2021-10-15 IMAGING — US US BREAST BX W LOC DEV 1ST LESION IMG BX SPEC US GUIDE*L*
1 series · 12 of 12 positions shown · non-contrast
Comparison: Previous exam(s).
COMPARISON: Previous exam(s).

Addendum:
CLINICAL DATA: 24-year-old female presenting for biopsy of a left
breast mass.

EXAM:
ULTRASOUND GUIDED LEFT BREAST CORE NEEDLE BIOPSY

[Series 1: us breast bx w loc dev 1st lesion img bx spec us g · 0.07mm/px · 12 of 12 slices shown]
[im 1/12]
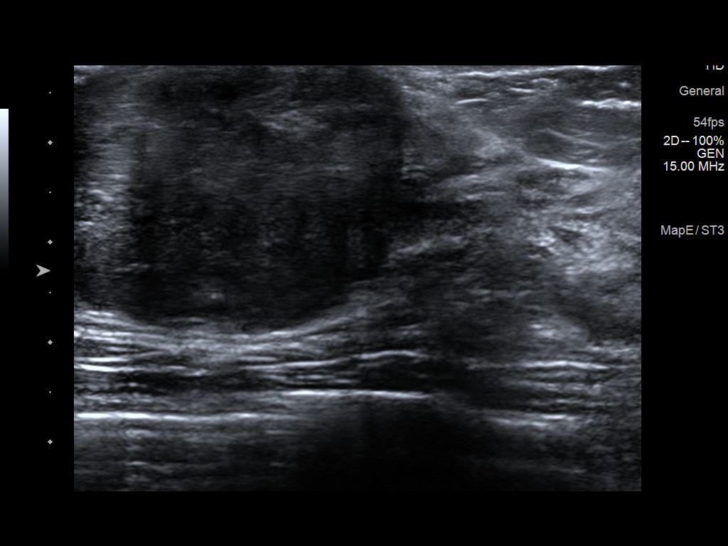
[im 2/12]
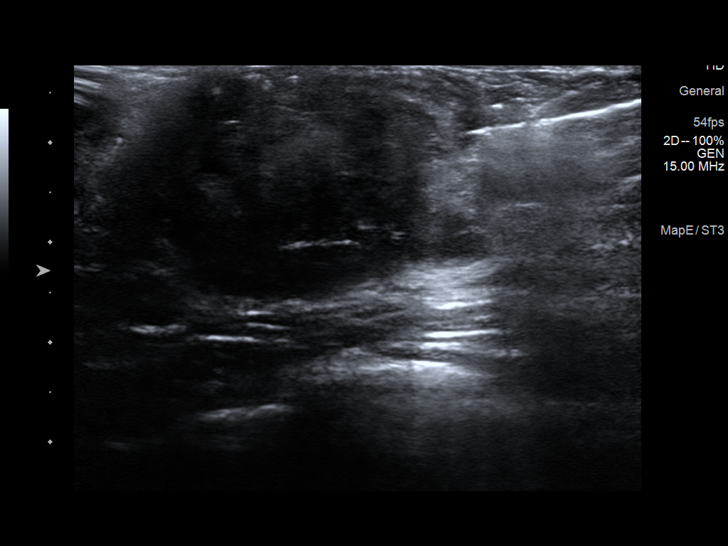
[im 3/12]
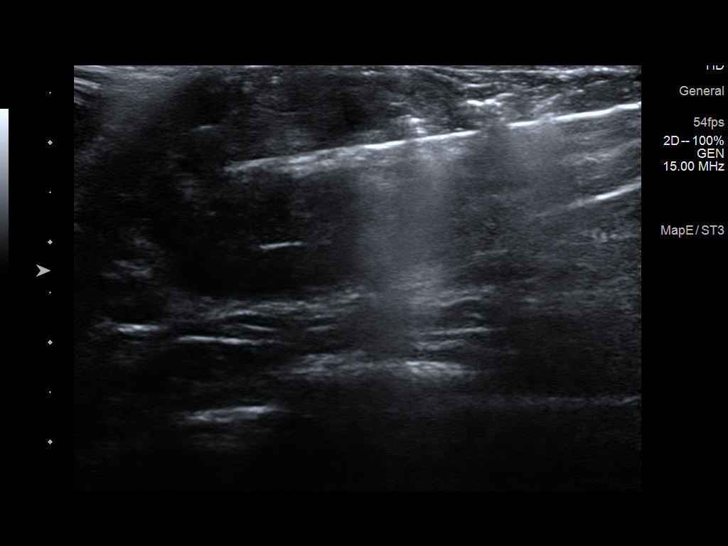
[im 4/12]
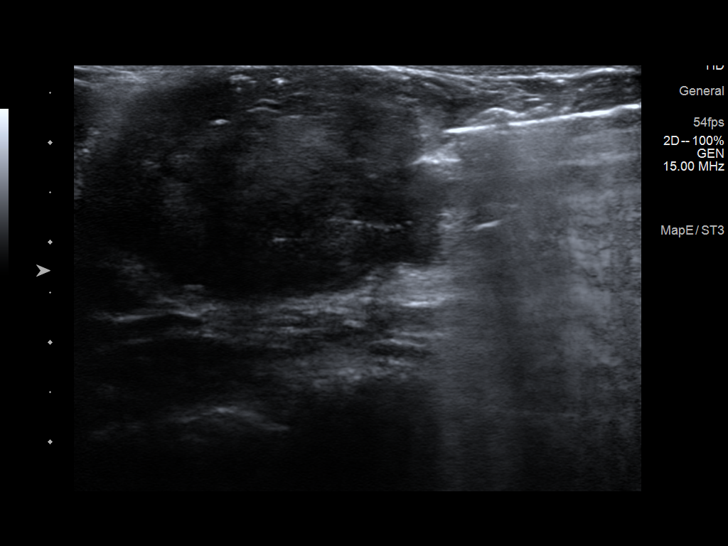
[im 5/12]
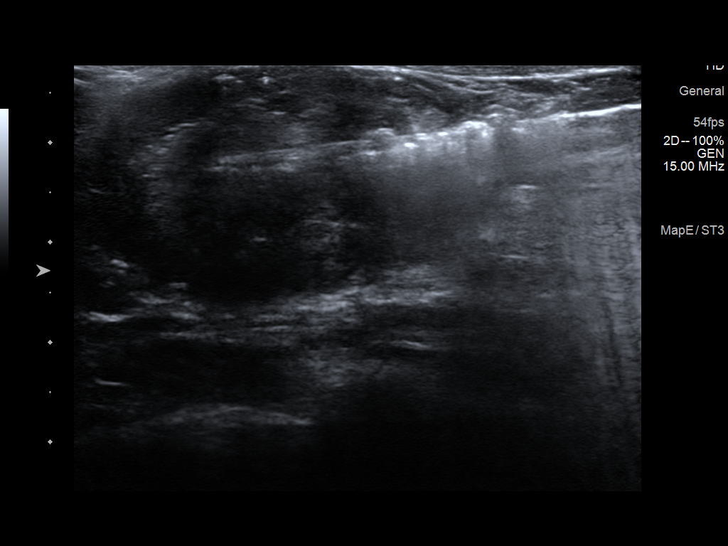
[im 6/12]
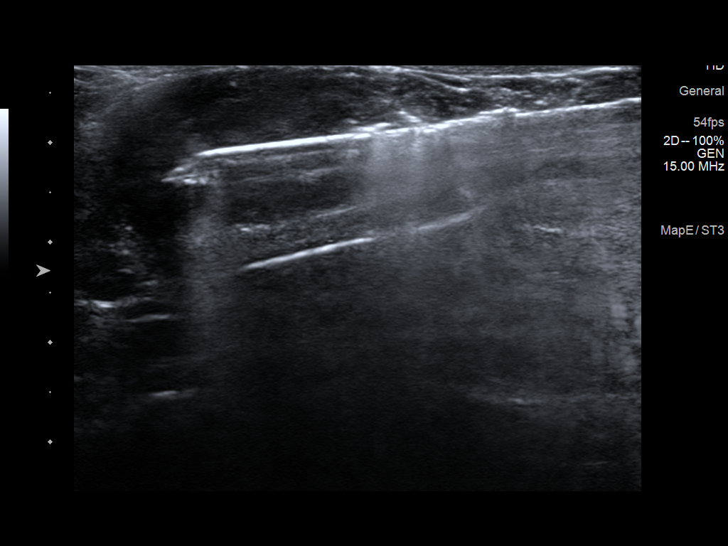
[im 7/12]
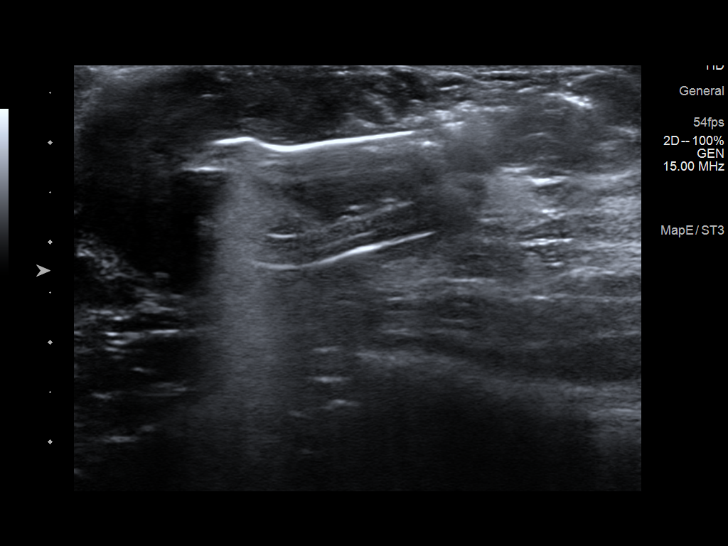
[im 8/12]
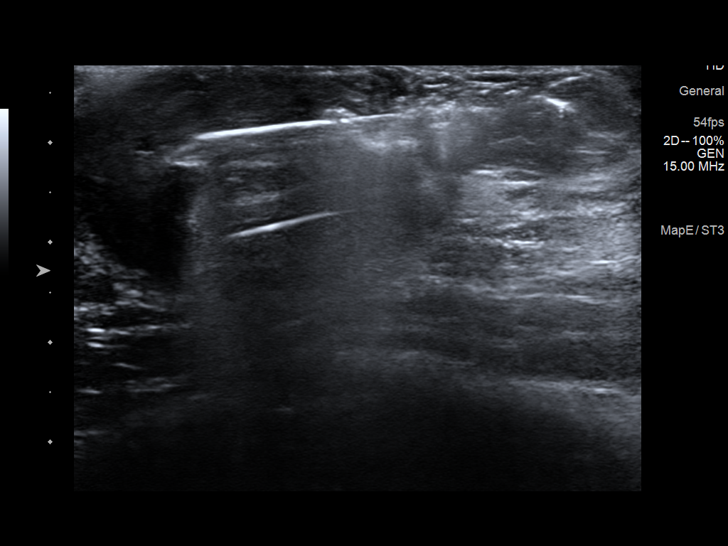
[im 9/12]
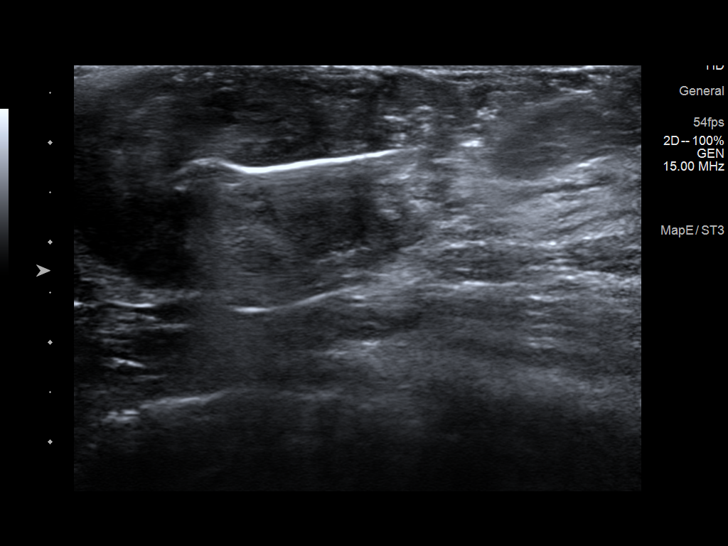
[im 10/12]
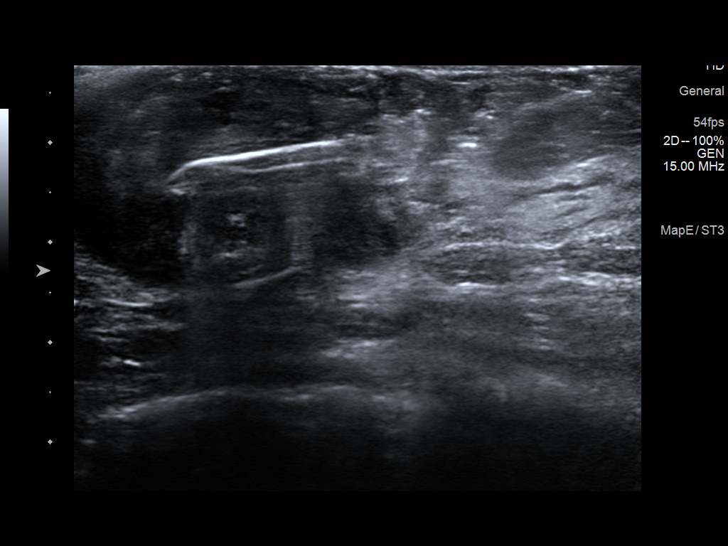
[im 11/12]
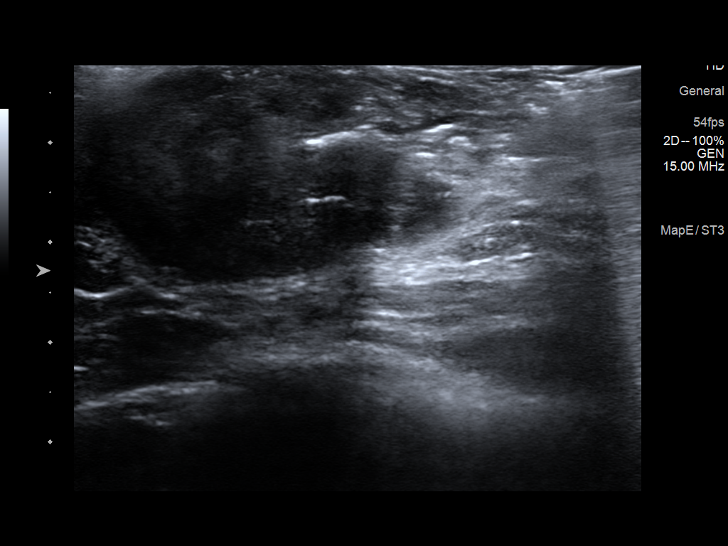
[im 12/12]
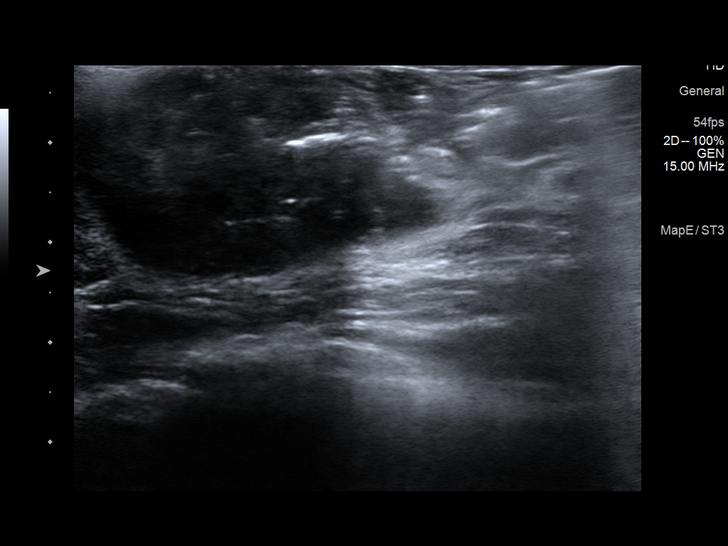

[12 of 12 positions shown; findings below may reference images not displayed]



Lesion quadrant: Lower outer quadrant

Using sterile technique and 1% Lidocaine as local anesthetic, under
direct ultrasound visualization, a 12 gauge Levy device was
used to perform biopsy of a mass in the left breast at 6
o'clock/retroareolar using a lateral approach. At the conclusion of
the procedure a ribbon tissue marker clip was deployed into the
biopsy cavity.
IMPRESSION: Ultrasound guided biopsy of a left breast mass at 6
o'clock/retroareolar. No apparent complications.

ADDENDUM:
Pathology revealed FIBROADENOMA of the Left breast, 6 o'clock,
retroareolar. This was found to be concordant by Dr. Moumin Med
Loaf.

Pathology results were discussed with the patient by telephone. The
patient reported doing well after the biopsy with tenderness at the
site. Post biopsy instructions and care were reviewed and questions
were answered. The patient was encouraged to call The [REDACTED]

Surgical consultation has been arranged with Dr. Ourari Tiger at
[REDACTED] on June 07, 2019.

Pathology results reported by Ourari Tiger, RN on 06/05/2019.



Lesion quadrant: Lower outer quadrant

Using sterile technique and 1% Lidocaine as local anesthetic, under
direct ultrasound visualization, a 12 gauge Levy device was
used to perform biopsy of a mass in the left breast at 6
o'clock/retroareolar using a lateral approach. At the conclusion of
the procedure a ribbon tissue marker clip was deployed into the
biopsy cavity.
IMPRESSION: Ultrasound guided biopsy of a left breast mass at 6
o'clock/retroareolar. No apparent complications.

## 2021-12-03 IMAGING — US US PLC BREAST LOC DEV 1ST LESION INC US GUIDE*L*
1 series · 1 of 1 positions shown · non-contrast
Comparison: Previous exam(s).

CLINICAL DATA: Localization of a left breast mass prior to surgery.

EXAM:
ULTRASOUND GUIDED RADIOACTIVE SEED LOCALIZATION OF THE LEFT BREAST

[Series 1: us plc breast loc dev 1st lesion inc us guide*left · 0.07mm/px · 1 of 1 slices shown]
[im 1/1]
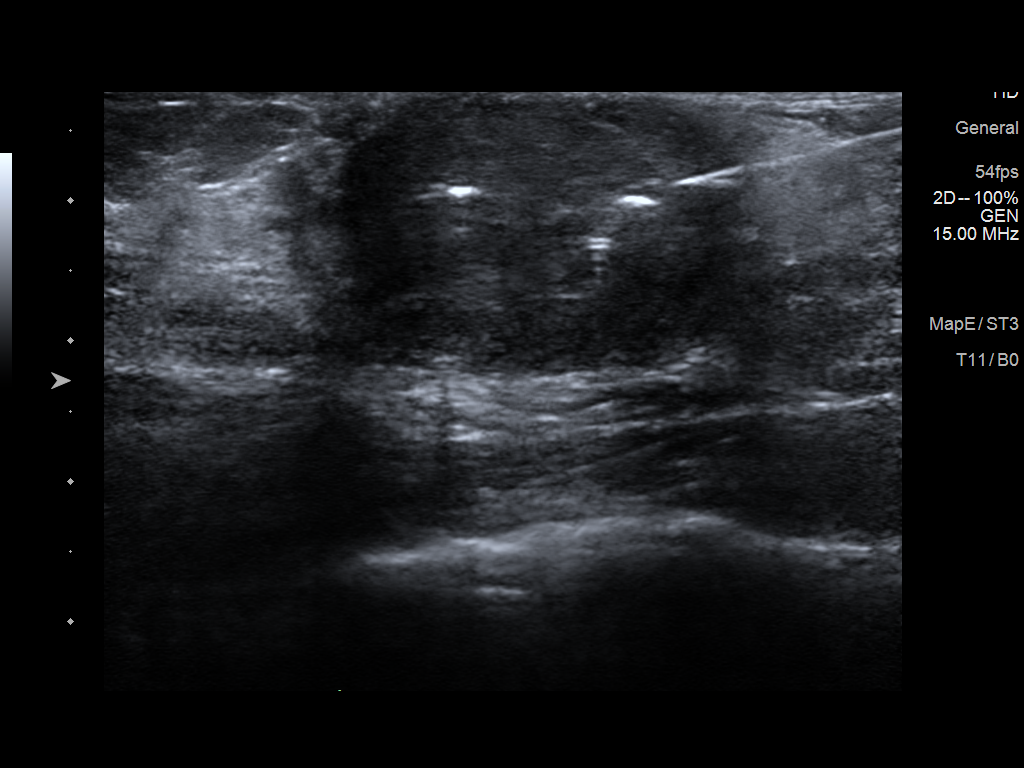

[1 of 1 positions shown; findings below may reference images not displayed]



The usual time-out protocol was performed immediately prior to the
procedure.

Using ultrasound guidance, sterile technique, 1% lidocaine and an
F-UZR radioactive seed, the 6 o'clock left breast mass was localized
using a medial approach. The follow-up mammogram images confirm the
seed in the expected location and were marked for Dr. Elissa.

Follow-up survey of the patient confirms presence of the radioactive
seed.

Order number of F-UZR seed:  [id number].

Total activity:  0.246 millicuries reference Date: July 05, 2019

The patient tolerated the procedure well and was released from the
[REDACTED]. She was given instructions regarding seed removal.
IMPRESSION: Radioactive seed localization left breast. No apparent
complications.

## 2021-12-04 IMAGING — MG MM BREAST SURGICAL SPECIMEN
1 series · 1 of 1 positions shown · non-contrast
Comparison: Previous exam(s).

CLINICAL DATA: Specimen radiograph status post left breast
excisional biopsy.

EXAM:
SPECIMEN RADIOGRAPH OF THE LEFT BREAST

[L]
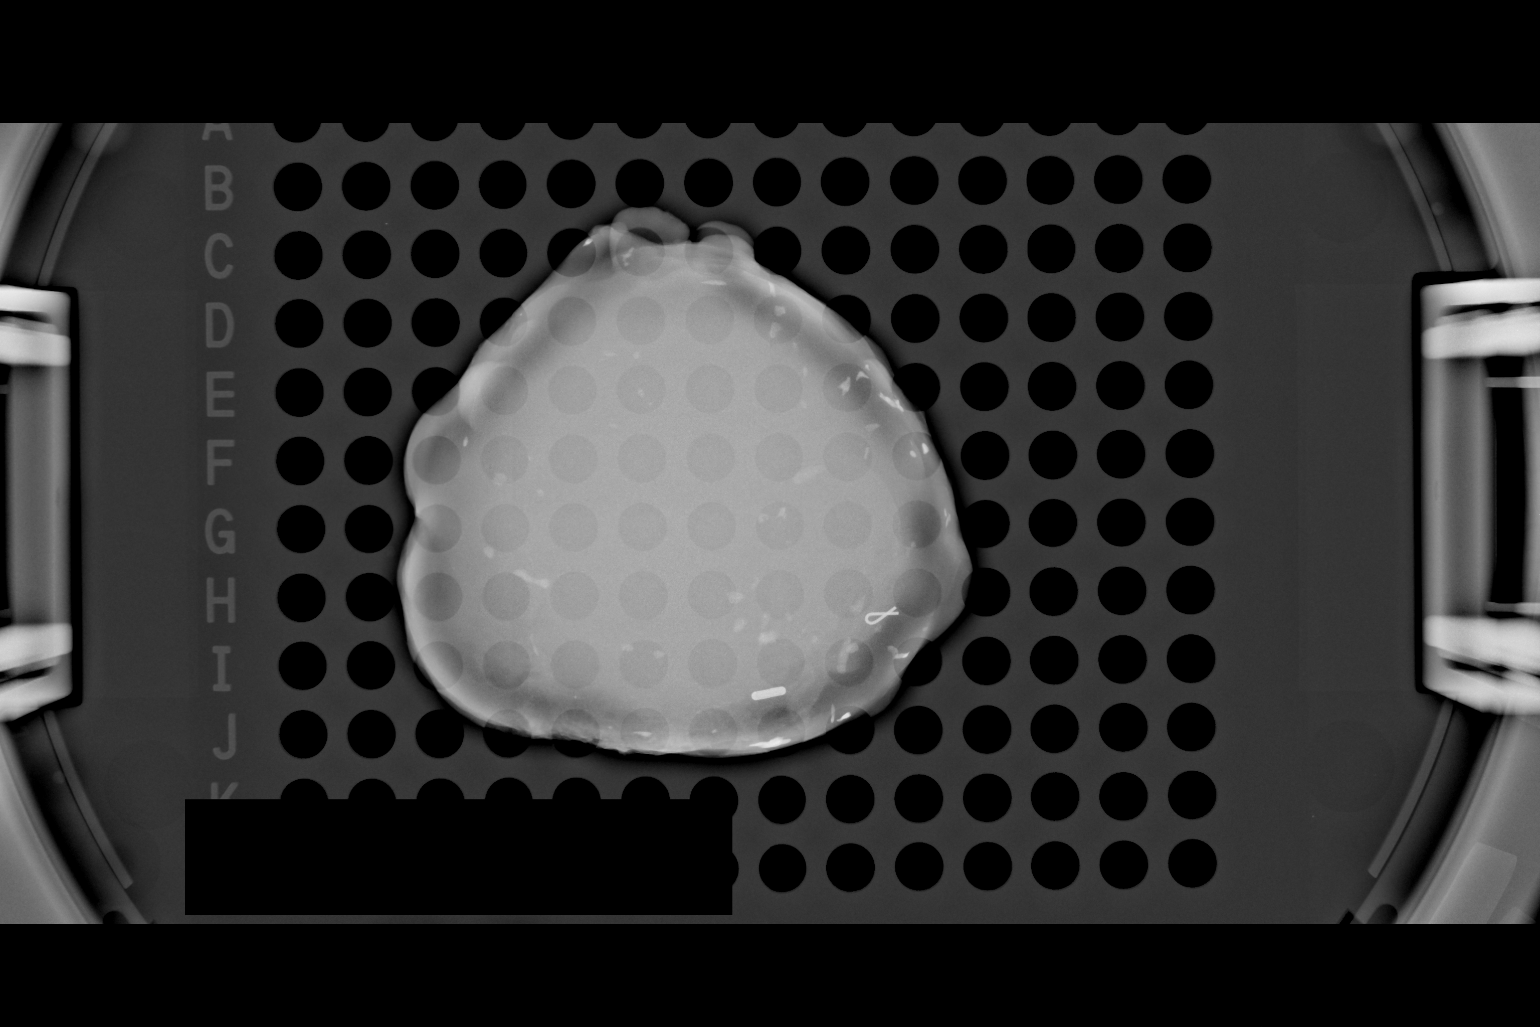

[1 of 1 positions shown; findings below may reference images not displayed]

FINDINGS: Status post excision of the left breast. The radioactive seed and
biopsy marker clip are present, completely intact, and were marked
for pathology. These findings were communicated to the OR at [DATE]
p.m.
IMPRESSION: Specimen radiograph of the left breast.

## 2023-07-05 ENCOUNTER — Ambulatory Visit: Payer: Self-pay | Admitting: Allergy
